# Patient Record
Sex: Female | Born: 1965 | Race: White | Hispanic: No | Marital: Married | Smoking: Current every day smoker
Health system: Southern US, Community
[De-identification: ages and names within clinical notes are randomized; demographics above are authoritative.]

## PROBLEM LIST (undated history)

## (undated) DIAGNOSIS — K219 Gastro-esophageal reflux disease without esophagitis: Secondary | ICD-10-CM

---

## 1998-02-01 ENCOUNTER — Emergency Department (HOSPITAL_COMMUNITY): Admission: EM | Admit: 1998-02-01 | Discharge: 1998-02-01 | Payer: Self-pay | Admitting: Emergency Medicine

## 1998-07-28 ENCOUNTER — Emergency Department (HOSPITAL_COMMUNITY): Admission: EM | Admit: 1998-07-28 | Discharge: 1998-07-28 | Payer: Self-pay | Admitting: Emergency Medicine

## 1998-08-13 ENCOUNTER — Emergency Department (HOSPITAL_COMMUNITY): Admission: EM | Admit: 1998-08-13 | Discharge: 1998-08-13 | Payer: Self-pay | Admitting: Internal Medicine

## 2004-02-12 ENCOUNTER — Emergency Department (HOSPITAL_COMMUNITY): Admission: EM | Admit: 2004-02-12 | Discharge: 2004-02-12 | Payer: Self-pay | Admitting: Family Medicine

## 2004-02-27 ENCOUNTER — Emergency Department (HOSPITAL_COMMUNITY): Admission: EM | Admit: 2004-02-27 | Discharge: 2004-02-27 | Payer: Self-pay | Admitting: Family Medicine

## 2006-06-12 ENCOUNTER — Ambulatory Visit: Payer: Self-pay | Admitting: Sports Medicine

## 2006-08-31 ENCOUNTER — Ambulatory Visit: Payer: Self-pay | Admitting: Sports Medicine

## 2006-09-10 ENCOUNTER — Emergency Department (HOSPITAL_COMMUNITY): Admission: EM | Admit: 2006-09-10 | Discharge: 2006-09-10 | Payer: Self-pay | Admitting: Emergency Medicine

## 2006-10-10 ENCOUNTER — Telehealth (INDEPENDENT_AMBULATORY_CARE_PROVIDER_SITE_OTHER): Payer: Self-pay | Admitting: *Deleted

## 2006-10-15 ENCOUNTER — Encounter (INDEPENDENT_AMBULATORY_CARE_PROVIDER_SITE_OTHER): Payer: Self-pay | Admitting: Sports Medicine

## 2006-10-25 ENCOUNTER — Ambulatory Visit: Payer: Self-pay | Admitting: Family Medicine

## 2006-10-25 DIAGNOSIS — F17200 Nicotine dependence, unspecified, uncomplicated: Secondary | ICD-10-CM | POA: Insufficient documentation

## 2006-10-30 ENCOUNTER — Encounter (INDEPENDENT_AMBULATORY_CARE_PROVIDER_SITE_OTHER): Payer: Self-pay | Admitting: Sports Medicine

## 2006-11-15 ENCOUNTER — Ambulatory Visit: Payer: Self-pay | Admitting: Family Medicine

## 2007-04-29 ENCOUNTER — Ambulatory Visit: Payer: Self-pay | Admitting: Family Medicine

## 2007-04-29 ENCOUNTER — Encounter: Payer: Self-pay | Admitting: Family Medicine

## 2007-04-29 DIAGNOSIS — R5383 Other fatigue: Secondary | ICD-10-CM

## 2007-04-29 DIAGNOSIS — N6019 Diffuse cystic mastopathy of unspecified breast: Secondary | ICD-10-CM | POA: Insufficient documentation

## 2007-04-29 DIAGNOSIS — N920 Excessive and frequent menstruation with regular cycle: Secondary | ICD-10-CM

## 2007-04-29 DIAGNOSIS — R5381 Other malaise: Secondary | ICD-10-CM

## 2007-04-29 LAB — CONVERTED CEMR LAB
Hemoglobin: 14.4 g/dL (ref 12.0–15.0)
LDL Cholesterol: 61 mg/dL (ref 0–99)
MCHC: 33.3 g/dL (ref 30.0–36.0)
RBC: 4.68 M/uL (ref 3.87–5.11)
Triglycerides: 41 mg/dL (ref <150)

## 2007-05-01 ENCOUNTER — Encounter: Payer: Self-pay | Admitting: Family Medicine

## 2007-05-28 ENCOUNTER — Encounter: Admission: RE | Admit: 2007-05-28 | Discharge: 2007-05-28 | Payer: Self-pay | Admitting: Sports Medicine

## 2007-06-05 ENCOUNTER — Encounter: Admission: RE | Admit: 2007-06-05 | Discharge: 2007-06-05 | Payer: Self-pay | Admitting: Sports Medicine

## 2007-06-06 ENCOUNTER — Encounter: Payer: Self-pay | Admitting: Family Medicine

## 2007-11-06 ENCOUNTER — Encounter (INDEPENDENT_AMBULATORY_CARE_PROVIDER_SITE_OTHER): Payer: Self-pay | Admitting: *Deleted

## 2007-11-06 ENCOUNTER — Ambulatory Visit: Payer: Self-pay | Admitting: Family Medicine

## 2007-11-06 DIAGNOSIS — L259 Unspecified contact dermatitis, unspecified cause: Secondary | ICD-10-CM | POA: Insufficient documentation

## 2007-11-06 DIAGNOSIS — R110 Nausea: Secondary | ICD-10-CM | POA: Insufficient documentation

## 2007-11-06 LAB — CONVERTED CEMR LAB
AST: 13 units/L (ref 0–37)
Alkaline Phosphatase: 44 units/L (ref 39–117)
BUN: 15 mg/dL (ref 6–23)
Beta hcg, urine, semiquantitative: NEGATIVE
Glucose, Bld: 98 mg/dL (ref 70–99)
Glucose, Urine, Semiquant: NEGATIVE
Ketones, urine, test strip: NEGATIVE
Nitrite: NEGATIVE
Protein, U semiquant: NEGATIVE
Specific Gravity, Urine: >=1.03

## 2007-11-07 ENCOUNTER — Telehealth: Payer: Self-pay | Admitting: *Deleted

## 2008-02-27 ENCOUNTER — Ambulatory Visit: Payer: Self-pay | Admitting: Family Medicine

## 2008-02-27 LAB — CONVERTED CEMR LAB
BUN: 11 mg/dL (ref 6–23)
Calcium: 8.9 mg/dL (ref 8.4–10.5)
Creatinine, Ser: 0.77 mg/dL (ref 0.40–1.20)

## 2008-02-28 ENCOUNTER — Encounter: Payer: Self-pay | Admitting: Family Medicine

## 2008-05-28 ENCOUNTER — Encounter: Admission: RE | Admit: 2008-05-28 | Discharge: 2008-05-28 | Payer: Self-pay | Admitting: Family Medicine

## 2009-06-11 ENCOUNTER — Encounter: Admission: RE | Admit: 2009-06-11 | Discharge: 2009-06-11 | Payer: Self-pay | Admitting: Family Medicine

## 2009-06-11 ENCOUNTER — Encounter: Payer: Self-pay | Admitting: Family Medicine

## 2010-02-08 ENCOUNTER — Emergency Department (HOSPITAL_COMMUNITY): Admission: EM | Admit: 2010-02-08 | Discharge: 2010-02-08 | Payer: Self-pay | Admitting: Emergency Medicine

## 2010-06-27 ENCOUNTER — Emergency Department (HOSPITAL_COMMUNITY): Admission: EM | Admit: 2010-06-27 | Discharge: 2010-06-27 | Payer: Self-pay | Admitting: Emergency Medicine

## 2010-09-25 ENCOUNTER — Encounter: Payer: Self-pay | Admitting: *Deleted

## 2010-11-20 IMAGING — MG MM SCREEN MAMMOGRAM BILATERAL
4 series · 4 of 4 positions shown · non-contrast
Comparison: Prior studies.

DG SCREEN MAMMOGRAM BILATERAL
Bilateral CC and MLO view(s) were taken.
Technologist: Lusine Jim
Prior study comparison: May 28, 2007, DG screen mammogram bilateral.

DIGITAL SCREENING MAMMOGRAM WITH CAD:

[R CC]
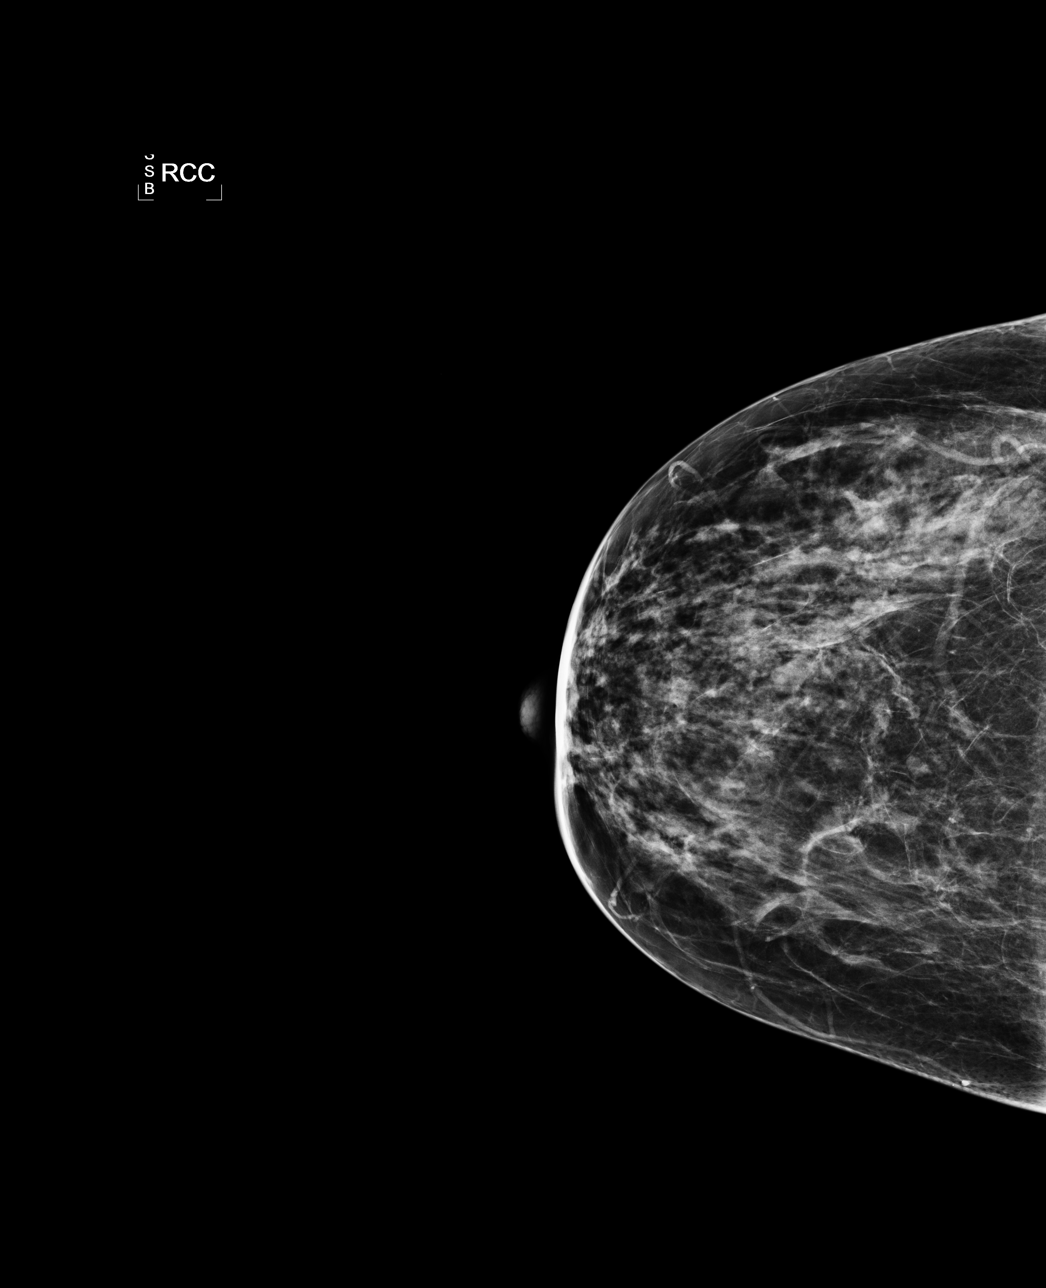

[L CC]
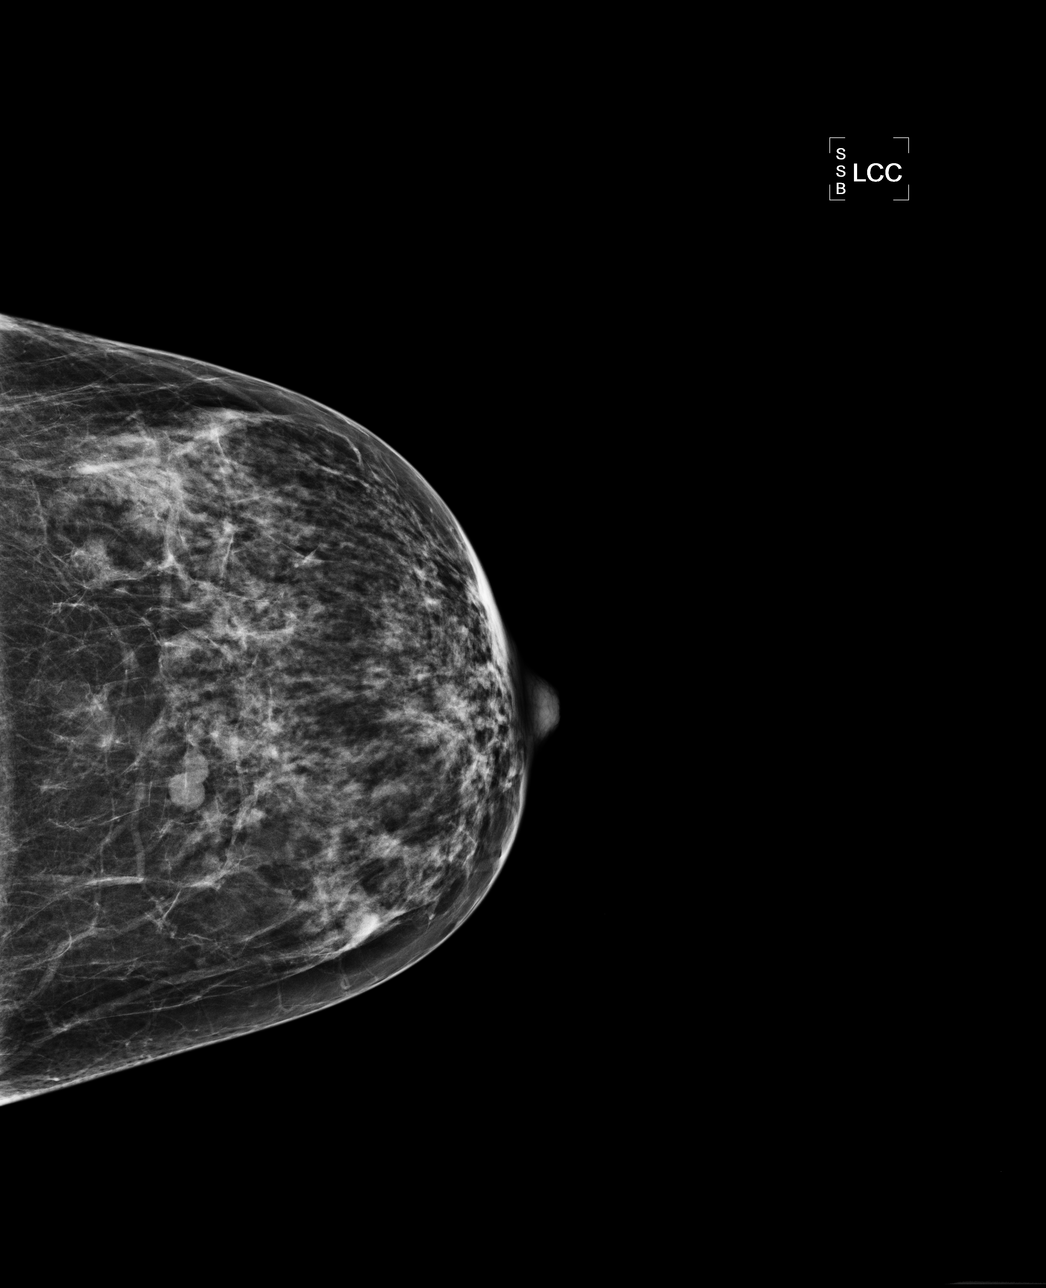

[L MLO]
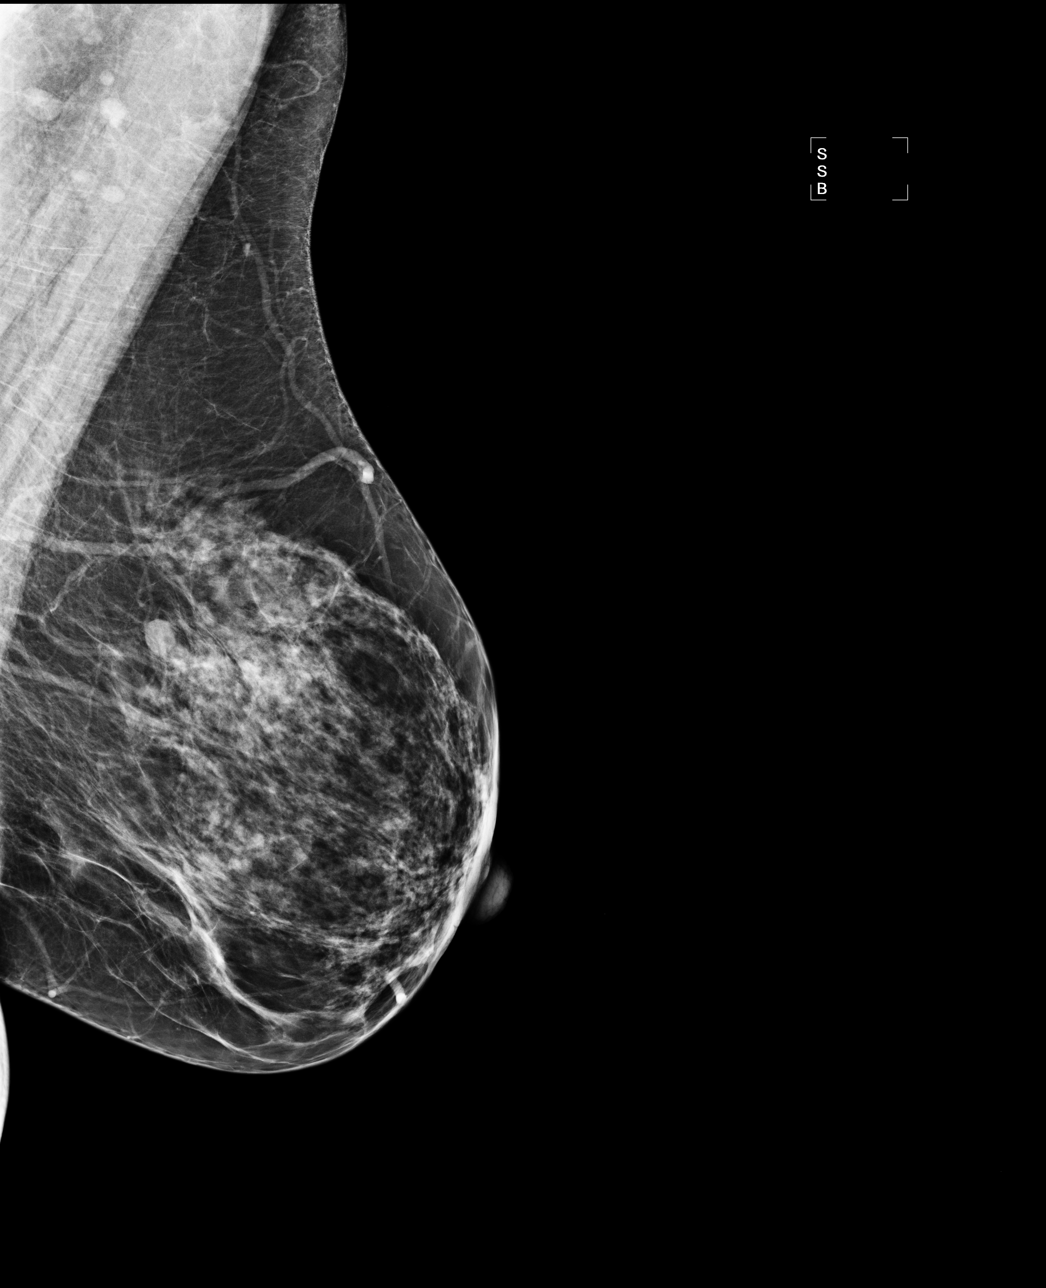

[R MLO]
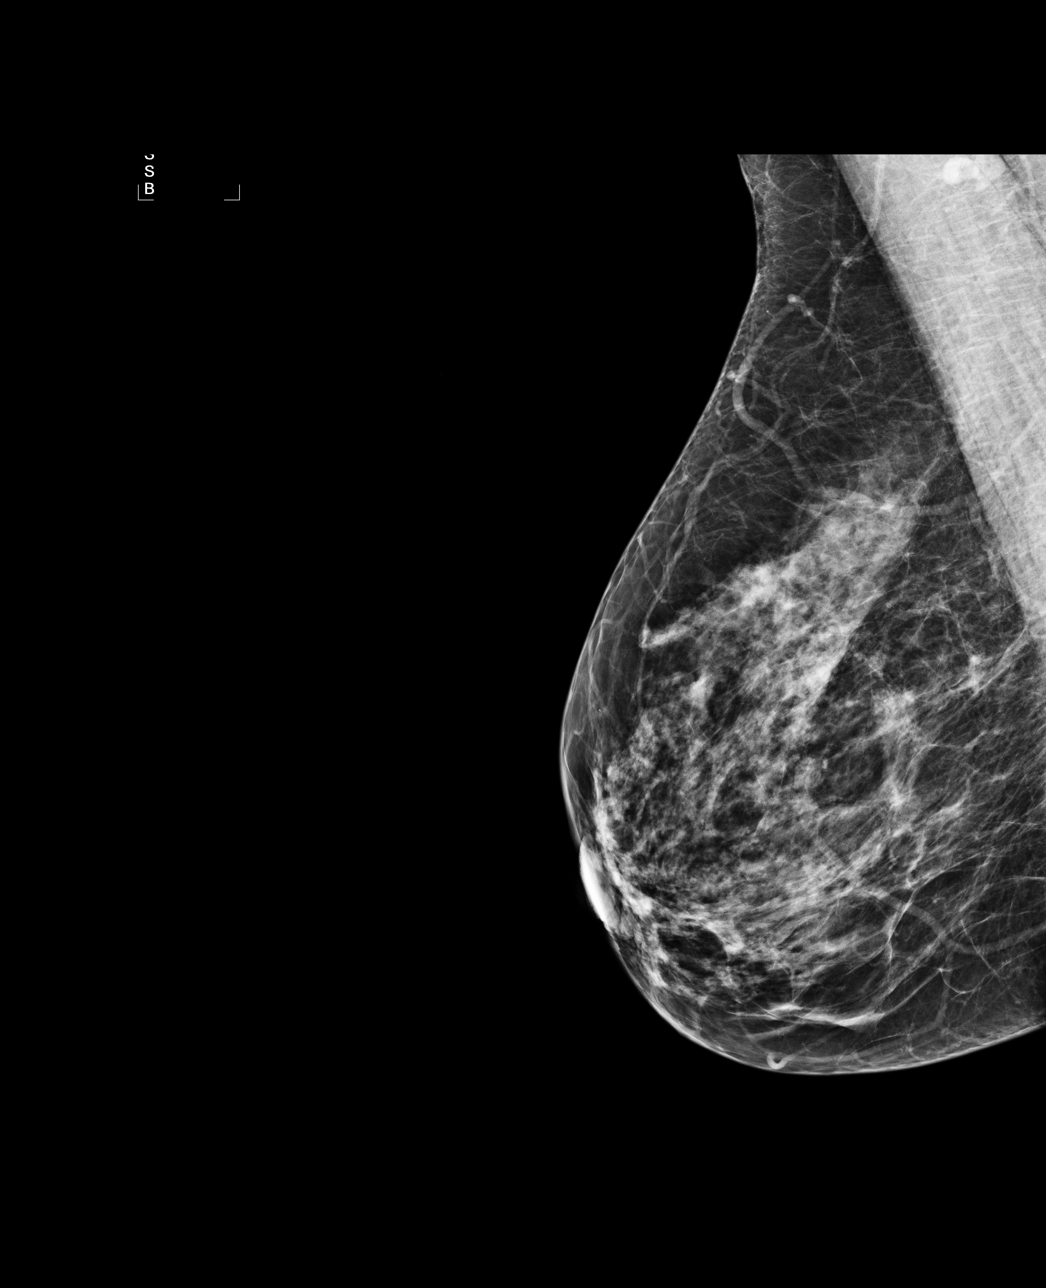

[4 of 4 positions shown; findings below may reference images not displayed]

There are scattered fibroglandular densities.  There is no dominant mass, architectural distortion 
or calcification to suggest malignancy.

Images were processed with CAD.
IMPRESSION: No mammographic evidence of malignancy.  Suggest yearly screening mammography.

A result letter of this screening mammogram will be mailed directly to the patient.

ASSESSMENT: Negative - BI-RADS 1

Screening mammogram in 1 year.
,

## 2013-01-06 ENCOUNTER — Emergency Department (HOSPITAL_COMMUNITY)
Admission: EM | Admit: 2013-01-06 | Discharge: 2013-01-06 | Disposition: A | Discharge status: Home / Self Care | Payer: Self-pay | Attending: Emergency Medicine | Admitting: Emergency Medicine

## 2013-01-06 ENCOUNTER — Encounter (HOSPITAL_COMMUNITY): Payer: Self-pay | Admitting: Emergency Medicine

## 2013-01-06 DIAGNOSIS — F17200 Nicotine dependence, unspecified, uncomplicated: Secondary | ICD-10-CM | POA: Insufficient documentation

## 2013-01-06 DIAGNOSIS — R197 Diarrhea, unspecified: Secondary | ICD-10-CM | POA: Insufficient documentation

## 2013-01-06 DIAGNOSIS — Z3202 Encounter for pregnancy test, result negative: Secondary | ICD-10-CM | POA: Insufficient documentation

## 2013-01-06 DIAGNOSIS — R112 Nausea with vomiting, unspecified: Secondary | ICD-10-CM | POA: Insufficient documentation

## 2013-01-06 DIAGNOSIS — K219 Gastro-esophageal reflux disease without esophagitis: Secondary | ICD-10-CM | POA: Insufficient documentation

## 2013-01-06 HISTORY — DX: Gastro-esophageal reflux disease without esophagitis: K21.9

## 2013-01-06 LAB — URINALYSIS, ROUTINE W REFLEX MICROSCOPIC
Bilirubin Urine: NEGATIVE
Ketones, ur: NEGATIVE mg/dL
Leukocytes, UA: NEGATIVE
Protein, ur: NEGATIVE mg/dL
Urobilinogen, UA: 0.2 mg/dL (ref 0.0–1.0)

## 2013-01-06 LAB — URINE MICROSCOPIC-ADD ON

## 2013-01-06 LAB — COMPREHENSIVE METABOLIC PANEL
AST: 23 U/L (ref 0–37)
Albumin: 4.1 g/dL (ref 3.5–5.2)
Alkaline Phosphatase: 76 U/L (ref 39–117)
CO2: 25 mEq/L (ref 19–32)
Chloride: 102 mEq/L (ref 96–112)
Creatinine, Ser: 0.75 mg/dL (ref 0.50–1.10)
GFR calc Af Amer: >90 mL/min (ref >90)
Glucose, Bld: 113 mg/dL — ABNORMAL HIGH (ref 70–99)
Potassium: 4.2 mEq/L (ref 3.5–5.1)
Total Bilirubin: 0.2 mg/dL — ABNORMAL LOW (ref 0.3–1.2)

## 2013-01-06 LAB — CBC WITH DIFFERENTIAL/PLATELET
Basophils Absolute: 0.1 10*3/uL (ref 0.0–0.1)
Hemoglobin: 16 g/dL — ABNORMAL HIGH (ref 12.0–15.0)
Lymphocytes Relative: 31 % (ref 12–46)
MCH: 30.6 pg (ref 26.0–34.0)
MCV: 90.4 fL (ref 78.0–100.0)
Neutro Abs: 4 10*3/uL (ref 1.7–7.7)
Platelets: 307 10*3/uL (ref 150–400)
RBC: 5.23 MIL/uL — ABNORMAL HIGH (ref 3.87–5.11)

## 2013-01-06 LAB — POCT PREGNANCY, URINE: Preg Test, Ur: NEGATIVE

## 2013-01-06 MED ORDER — SODIUM CHLORIDE 0.9 % IV BOLUS (SEPSIS)
1000.0000 mL | Freq: Once | INTRAVENOUS | Status: AC
  Administered 2013-01-06: 1000 mL via INTRAVENOUS

## 2013-01-06 MED ORDER — ONDANSETRON HCL 4 MG PO TABS: 4.0000 mg | ORAL_TABLET | Freq: Three times a day (TID) | ORAL | Status: DC | PRN

## 2013-01-06 NOTE — ED Notes (Signed)
Pt states nausea x 4-5 months but just started to have n/v/d since Friday . No  Vomiting today but cont w/ diarrhera has had bad acid reflux also states has a beer or 2  Everyday after work. Has only had yougart since friday

## 2013-01-06 NOTE — ED Notes (Signed)
Pt ambulated to restroom. Tolerated well.

## 2013-01-06 NOTE — ED Provider Notes (Signed)
History     CSN: [id number]  Arrival date & time 01/06/13  1111   First MD Initiated Contact with Patient 01/06/13 1131      Chief Complaint  Patient presents with  . Abdominal Pain    (Consider location/radiation/quality/duration/timing/severity/associated sxs/prior treatment) HPI Comments: Patient presents emergency department with chief complaint of nausea, vomiting, and diarrhea since Friday. She states that the vomiting has eased off today. She is still having diarrhea. She denies seeing any blood in her vomit or stool. She states that nothing makes her symptoms any better. She states that she feels dehydrated. She denies fever, chills, chest pain, shortness of breath, constipation, dysuria, or vaginal discharge. She has not taken anything to alleviate her symptoms.  Patient states that her grandchildren have been sick with similar.  The history is provided by the patient. No language interpreter was used.    Past Medical History  Diagnosis Date  . Acid reflux     History reviewed. No pertinent past surgical history.  History reviewed. No pertinent family history.  History  Substance Use Topics  . Smoking status: Current Every Day Smoker  . Smokeless tobacco: Not on file  . Alcohol Use: Yes    OB History   Grav Para Term Preterm Abortions TAB SAB Ect Mult Living                  Review of Systems  All other systems reviewed and are negative.    Allergies  Penicillins  Home Medications   Current Outpatient Rx  Name  Route  Sig  Dispense  Refill  . Cimetidine (ACID REDUCER PO)   Oral   Take 1 tablet by mouth daily.         Marland Kitchen ibuprofen (ADVIL,MOTRIN) 200 MG tablet   Oral   Take 200 mg by mouth every 6 (six) hours as needed for pain.         . promethazine (PHENERGAN) 25 MG tablet   Oral   Take 25 mg by mouth every 6 (six) hours as needed.             BP 138/88  Pulse 107  Temp(Src) 98.2 F (36.8 C) (Oral)  SpO2 98%  Physical Exam   Nursing note and vitals reviewed. Constitutional: She is oriented to person, place, and time. She appears well-developed and well-nourished.  HENT:  Head: Normocephalic and atraumatic.  Eyes: Conjunctivae and EOM are normal. Pupils are equal, round, and reactive to light.  Neck: Normal range of motion. Neck supple.  Cardiovascular: Normal rate and regular rhythm.  Exam reveals no gallop and no friction rub.   No murmur heard. Pulmonary/Chest: Effort normal and breath sounds normal. No respiratory distress. She has no wheezes. She has no rales. She exhibits no tenderness.  Abdominal: Soft. Bowel sounds are normal. She exhibits no distension and no mass. There is no tenderness. There is no rebound and no guarding.  Diffuse abdominal discomfort, but no focal tenderness, no signs of surgical or acute abdomen, no fluid wave or signs of peritonitis  Musculoskeletal: Normal range of motion. She exhibits no edema and no tenderness.  Neurological: She is alert and oriented to person, place, and time.  Skin: Skin is warm and dry.  Psychiatric: She has a normal mood and affect. Her behavior is normal. Judgment and thought content normal.    ED Course  Procedures (including critical care time)  Labs Reviewed  CBC WITH DIFFERENTIAL - Abnormal; Notable for the following:  RBC 5.23 (*)    Hemoglobin 16.0 (*)    HCT 47.3 (*)    All other components within normal limits  COMPREHENSIVE METABOLIC PANEL  LIPASE, BLOOD  URINALYSIS, ROUTINE W REFLEX MICROSCOPIC   Results for orders placed during the hospital encounter of 01/06/13  COMPREHENSIVE METABOLIC PANEL      Result Value Range   Sodium 136  135 - 145 mEq/L   Potassium 4.2  3.5 - 5.1 mEq/L   Chloride 102  96 - 112 mEq/L   CO2 25  19 - 32 mEq/L   Glucose, Bld 113 (*) 70 - 99 mg/dL   BUN 9  6 - 23 mg/dL   Creatinine, Ser 1.61  0.50 - 1.10 mg/dL   Calcium 8.9  8.4 - 09.6 mg/dL   Total Protein 7.4  6.0 - 8.3 g/dL   Albumin 4.1  3.5 - 5.2  g/dL   AST 23  0 - 37 U/L   ALT 21  0 - 35 U/L   Alkaline Phosphatase 76  39 - 117 U/L   Total Bilirubin 0.2 (*) 0.3 - 1.2 mg/dL   GFR calc non Af Amer >90  >90 mL/min   GFR calc Af Amer >90  >90 mL/min  LIPASE, BLOOD      Result Value Range   Lipase 19  11 - 59 U/L  CBC WITH DIFFERENTIAL      Result Value Range   WBC 7.0  4.0 - 10.5 K/uL   RBC 5.23 (*) 3.87 - 5.11 MIL/uL   Hemoglobin 16.0 (*) 12.0 - 15.0 g/dL   HCT 04.5 (*) 40.9 - 81.1 %   MCV 90.4  78.0 - 100.0 fL   MCH 30.6  26.0 - 34.0 pg   MCHC 33.8  30.0 - 36.0 g/dL   RDW 91.4  78.2 - 95.6 %   Platelets 307  150 - 400 K/uL   Neutrophils Relative % 58  43 - 77 %   Neutro Abs 4.0  1.7 - 7.7 K/uL   Lymphocytes Relative 31  12 - 46 %   Lymphs Abs 2.2  0.7 - 4.0 K/uL   Monocytes Relative 6  3 - 12 %   Monocytes Absolute 0.4  0.1 - 1.0 K/uL   Eosinophils Relative 4  0 - 5 %   Eosinophils Absolute 0.3  0.0 - 0.7 K/uL   Basophils Relative 1  0 - 1 %   Basophils Absolute 0.1  0.0 - 0.1 K/uL  URINALYSIS, ROUTINE W REFLEX MICROSCOPIC      Result Value Range   Color, Urine YELLOW  YELLOW   APPearance CLEAR  CLEAR   Specific Gravity, Urine 1.013  1.005 - 1.030   pH 6.0  5.0 - 8.0   Glucose, UA NEGATIVE  NEGATIVE mg/dL   Hgb urine dipstick TRACE (*) NEGATIVE   Bilirubin Urine NEGATIVE  NEGATIVE   Ketones, ur NEGATIVE  NEGATIVE mg/dL   Protein, ur NEGATIVE  NEGATIVE mg/dL   Urobilinogen, UA 0.2  0.0 - 1.0 mg/dL   Nitrite NEGATIVE  NEGATIVE   Leukocytes, UA NEGATIVE  NEGATIVE  URINE MICROSCOPIC-ADD ON      Result Value Range   Squamous Epithelial / LPF FEW (*) RARE   WBC, UA 0-2  <3 WBC/hpf   RBC / HPF 0-2  <3 RBC/hpf   Bacteria, UA FEW (*) RARE  POCT PREGNANCY, URINE      Result Value Range   Preg Test, Ur NEGATIVE  NEGATIVE  No results found.    1. Nausea vomiting and diarrhea       MDM  Patient with nausea, vomiting, and diarrhea. Suspect this is likely viral in origin. Will treat the patient with fluids,  and Zofran needed. Check basic labs, and reevaluate.  Labs are reassuring.  Given a liter of fluid and encourage rehydration.  Suspect viral gastroenteritis.  No focal abdominal tenderness.  No fever.  Will discharge to home with zofran.  Specific return precautions given.  Patient is stable and ready for discharge.  Tolerating PO.       Roxy Horseman, PA-C 01/06/13 1520

## 2013-01-07 NOTE — ED Provider Notes (Signed)
Medical screening examination/treatment/procedure(s) were performed by non-physician practitioner and as supervising physician I was immediately available for consultation/collaboration.   Carleene Cooper III, MD 01/07/13 [phone]

## 2017-10-16 ENCOUNTER — Emergency Department (HOSPITAL_COMMUNITY): Payer: Self-pay

## 2017-10-16 ENCOUNTER — Encounter (HOSPITAL_COMMUNITY): Payer: Self-pay | Admitting: Emergency Medicine

## 2017-10-16 ENCOUNTER — Emergency Department (HOSPITAL_COMMUNITY)
Admission: EM | Admit: 2017-10-16 | Discharge: 2017-10-16 | Disposition: A | Discharge status: Home / Self Care | Payer: Self-pay | Attending: Emergency Medicine | Admitting: Emergency Medicine

## 2017-10-16 ENCOUNTER — Other Ambulatory Visit: Payer: Self-pay

## 2017-10-16 DIAGNOSIS — R6889 Other general symptoms and signs: Secondary | ICD-10-CM | POA: Insufficient documentation

## 2017-10-16 DIAGNOSIS — F17200 Nicotine dependence, unspecified, uncomplicated: Secondary | ICD-10-CM | POA: Insufficient documentation

## 2017-10-16 LAB — CBC WITH DIFFERENTIAL/PLATELET
BASOS ABS: 0 10*3/uL (ref 0.0–0.1)
Basophils Relative: 1 %
Eosinophils Absolute: 0 10*3/uL (ref 0.0–0.7)
Eosinophils Relative: 0 %
HEMATOCRIT: 41.9 % (ref 36.0–46.0)
HEMOGLOBIN: 13.9 g/dL (ref 12.0–15.0)
LYMPHS PCT: 20 %
Lymphs Abs: 1.1 10*3/uL (ref 0.7–4.0)
MCH: 30.8 pg (ref 26.0–34.0)
MCHC: 33.2 g/dL (ref 30.0–36.0)
MCV: 92.7 fL (ref 78.0–100.0)
MONO ABS: 0.4 10*3/uL (ref 0.1–1.0)
MONOS PCT: 8 %
NEUTROS ABS: 3.8 10*3/uL (ref 1.7–7.7)
NEUTROS PCT: 71 %
Platelets: 198 10*3/uL (ref 150–400)
RBC: 4.52 MIL/uL (ref 3.87–5.11)
RDW: 13.5 % (ref 11.5–15.5)
WBC: 5.4 10*3/uL (ref 4.0–10.5)

## 2017-10-16 LAB — URINALYSIS, ROUTINE W REFLEX MICROSCOPIC
GLUCOSE, UA: NEGATIVE mg/dL
KETONES UR: 15 mg/dL — AB
Leukocytes, UA: NEGATIVE
Nitrite: NEGATIVE
Protein, ur: 100 mg/dL — AB
Specific Gravity, Urine: >1.03 — ABNORMAL HIGH (ref 1.005–1.030)
pH: 6 (ref 5.0–8.0)

## 2017-10-16 LAB — COMPREHENSIVE METABOLIC PANEL
ALBUMIN: 3.6 g/dL (ref 3.5–5.0)
ALT: 17 U/L (ref 14–54)
ANION GAP: 10 (ref 5–15)
AST: 27 U/L (ref 15–41)
Alkaline Phosphatase: 56 U/L (ref 38–126)
BUN: 9 mg/dL (ref 6–20)
CHLORIDE: 102 mmol/L (ref 101–111)
CO2: 22 mmol/L (ref 22–32)
Calcium: 8.3 mg/dL — ABNORMAL LOW (ref 8.9–10.3)
Creatinine, Ser: 0.83 mg/dL (ref 0.44–1.00)
GFR calc Af Amer: >60 mL/min (ref >60)
GFR calc non Af Amer: >60 mL/min (ref >60)
GLUCOSE: 109 mg/dL — AB (ref 65–99)
POTASSIUM: 3.6 mmol/L (ref 3.5–5.1)
SODIUM: 134 mmol/L — AB (ref 135–145)
Total Bilirubin: 0.4 mg/dL (ref 0.3–1.2)
Total Protein: 6.3 g/dL — ABNORMAL LOW (ref 6.5–8.1)

## 2017-10-16 LAB — URINALYSIS, MICROSCOPIC (REFLEX)

## 2017-10-16 LAB — I-STAT CG4 LACTIC ACID, ED: LACTIC ACID, VENOUS: 0.52 mmol/L (ref 0.5–1.9)

## 2017-10-16 MED ORDER — BENZONATATE 100 MG PO CAPS: 100.0000 mg | ORAL_CAPSULE | Freq: Three times a day (TID) | ORAL | 0 refills | Status: AC

## 2017-10-16 MED ORDER — ONDANSETRON HCL 4 MG PO TABS: 4.0000 mg | ORAL_TABLET | Freq: Four times a day (QID) | ORAL | 0 refills | Status: AC

## 2017-10-16 NOTE — ED Triage Notes (Signed)
Pt c/o flu symptoms started Saturday night-- with vomiting, fever, -- unable to keep anything down

## 2017-10-16 NOTE — ED Provider Notes (Signed)
[facility] EMERGENCY DEPARTMENT Provider Note   CSN: [id number] Arrival date & time: 10/16/17  1127     History   Chief Complaint Chief Complaint  Patient presents with  . flu symptoms  . Emesis    HPI Tiffany Schroeder is a 52 y.o. female.  HPI   52 year old female presents today with complaints of nausea vomiting diarrhea and upper respiratory congestion.  Patient notes that symptoms started 4 days ago with fatigue, body aches, nausea vomiting and diarrhea.  She notes she progressed with nasal congestion rhinorrhea and cough.  Patient notes she is no longer having diarrhea or vomiting but continues to have fatigue body aches and congestion.  Patient denies any significant shortness of breath.  She reports fever at home, Tylenol prior to arrival.  Patient notes she is a smoker but otherwise healthy taking no significant medications.  She denies any past medical history including asthma, COPD, diabetes or hypertension.  Patient notes that she generally does not get sick.  Patient reports some generalized abdominal pain which she attributes to frequent coughing.  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  Past Medical History:  Diagnosis Date  . Acid reflux     Patient Active Problem List   Diagnosis Date Noted  . CONTACT DERMATITIS&OTHER ECZEMA DUE UNSPEC CAUSE 11/06/2007  . NAUSEA 11/06/2007  . FIBROCYSTIC BREAST DISEASE 04/29/2007  . MENORRHAGIA 04/29/2007  . Other malaise and fatigue 04/29/2007  . TOBACCO USE 10/25/2006    History reviewed. No pertinent surgical history.  OB History    No data available       Home Medications    Prior to Admission medications   Medication Sig Start Date End Date Taking? Authorizing Provider  benzonatate (TESSALON) 100 MG capsule Take 1 capsule (100 mg total) by mouth every 8 (eight) hours. 10/16/17   Crimson Dubberly, Tinnie Gens, PA-C  Cimetidine (ACID REDUCER PO) Take 1 tablet by mouth daily.    [provider]   ibuprofen (ADVIL,MOTRIN) 200 MG tablet Take 200 mg by mouth every 6 (six) hours as needed for pain.    [provider]  ondansetron (ZOFRAN) 4 MG tablet Take 1 tablet (4 mg total) by mouth every 6 (six) hours. 10/16/17   Tatelyn Vanhecke, Tinnie Gens, PA-C  promethazine (PHENERGAN) 25 MG tablet Take 25 mg by mouth every 6 (six) hours as needed.      [provider]    Family History No family history on file.  Social History Social History   Tobacco Use  . Smoking status: Current Every Day Smoker  . Smokeless tobacco: Never Used  Substance Use Topics  . Alcohol use: Yes  . Drug use: No     Allergies   Penicillins   Review of Systems Review of Systems  All other systems reviewed and are negative.    Physical Exam Updated Vital Signs BP 107/72 (BP Location: Right Arm)   Pulse (!) 113   Temp 99.6 F (37.6 C) (Oral)   Resp 17   SpO2 94%   Physical Exam  Constitutional: She is oriented to person, place, and time. She appears well-developed and well-nourished.  HENT:  Head: Normocephalic and atraumatic.  Nose: Rhinorrhea present.  Eyes: Conjunctivae are normal. Pupils are equal, round, and reactive to light. Right eye exhibits no discharge. Left eye exhibits no discharge. No scleral icterus.  Neck: Normal range of motion. No JVD present. No tracheal deviation present.  Cardiovascular: Regular rhythm, normal heart sounds and intact distal pulses.  Pulmonary/Chest: Effort normal and breath sounds normal. No stridor. No respiratory distress. She has no wheezes. She has no rales. She exhibits no tenderness.  Abdominal: Soft.  Generalized epigastric tenderness nonfocal nonsevere remainder abdominal exam nontender  Neurological: She is alert and oriented to person, place, and time. Coordination normal.  Skin: Skin is warm.  Psychiatric: She has a normal mood and affect. Her behavior is normal. Judgment and thought content normal.  Nursing note and vitals  reviewed.    ED Treatments / Results  Labs (all labs ordered are listed, but only abnormal results are displayed) Labs Reviewed  COMPREHENSIVE METABOLIC PANEL - Abnormal; Notable for the following components:      Result Value   Sodium 134 (*)    Glucose, Bld 109 (*)    Calcium 8.3 (*)    Total Protein 6.3 (*)    All other components within normal limits  URINALYSIS, ROUTINE W REFLEX MICROSCOPIC - Abnormal; Notable for the following components:   APPearance CLOUDY (*)    Specific Gravity, Urine >1.030 (*)    Hgb urine dipstick LARGE (*)    Bilirubin Urine MODERATE (*)    Ketones, ur 15 (*)    Protein, ur 100 (*)    All other components within normal limits  URINALYSIS, MICROSCOPIC (REFLEX) - Abnormal; Notable for the following components:   Bacteria, UA FEW (*)    Squamous Epithelial / LPF 6-30 (*)    All other components within normal limits  CBC WITH DIFFERENTIAL/PLATELET  I-STAT CG4 LACTIC ACID, ED  I-STAT CG4 LACTIC ACID, ED    EKG  EKG Interpretation None       Radiology Dg Chest 2 View  Result Date: 10/16/2017 CLINICAL DATA:  Flu symptoms.  Fever and vomiting. EXAM: CHEST - 2 VIEW COMPARISON:  No recent prior. FINDINGS: Mediastinum hilar structures normal. Lungs are clear. No pleural effusion or pneumothorax. Heart size normal. No acute bony abnormality. IMPRESSION: No acute abnormality. Electronically Signed   By: Maisie Fus  Register   On: 10/16/2017 12:12    Procedures Procedures (including critical care time)  Medications Ordered in ED Medications - No data to display   Initial Impression / Assessment and Plan / ED Course  I have reviewed the triage vital signs and the nursing notes.  Pertinent labs & imaging results that were available during my care of the patient were reviewed by me and considered in my medical decision making (see chart for details).      Final Clinical Impressions(s) / ED Diagnoses   Final diagnoses:  Flu-like symptoms     Labs: I-STAT lactic acid, CMP, CBC, urinalysis  Imaging: DG chest 2 view  Consults:  Therapeutics:  Discharge Meds: Zofran, Tessalon  Assessment/Plan: 52 year old female presents today with viral illness.  Patient with upper respiratory congestion.  Originally with nausea vomiting diarrhea.  Question influenza in this patient.  She is a smoker no diagnosed history of asthma COPD or any comp gating features.  I discussed testing and treatment patient, she would not like to take any medication for this.  Patient's laboratory analysis reassuring with no significant derangements, no signs of intra-abdominal pathology.  I find this reasonable at this time.  Patient is given strict return precautions, she is encouraged follow-up with her primary care if symptoms persist return if they worsen.  She verbalized understanding and agreement to today's plan had no further questions or concerns.     ED Discharge Orders        Ordered  ondansetron (ZOFRAN) 4 MG tablet  Every 6 hours     10/16/17 1340    benzonatate (TESSALON) 100 MG capsule  Every 8 hours     10/16/17 1340       Eyvonne MechanicHedges, Giavanna Kang, PA-C 10/16/17 1343    Mabe, Latanya MaudlinMartha L, MD 10/16/17 1409

## 2017-10-16 NOTE — Discharge Instructions (Signed)
Please read attached information. If you experience any new or worsening signs or symptoms please return to the emergency room for evaluation. Please follow-up with your primary care provider or specialist as discussed. Please use medication prescribed only as directed and discontinue taking if you have any concerning signs or symptoms.   °

## 2018-10-14 ENCOUNTER — Ambulatory Visit: Payer: Self-pay | Admitting: Family Medicine

## 2019-01-01 DIAGNOSIS — K2100 Gastro-esophageal reflux disease with esophagitis, without bleeding: Secondary | ICD-10-CM | POA: Insufficient documentation

## 2019-01-01 DIAGNOSIS — F17210 Nicotine dependence, cigarettes, uncomplicated: Secondary | ICD-10-CM | POA: Insufficient documentation

## 2019-03-27 IMAGING — DX DG CHEST 2V
2 series · 2 of 2 positions shown · non-contrast
Comparison: No recent prior.

CLINICAL DATA: Flu symptoms.  Fever and vomiting.

EXAM:
CHEST - 2 VIEW

[chest pa]
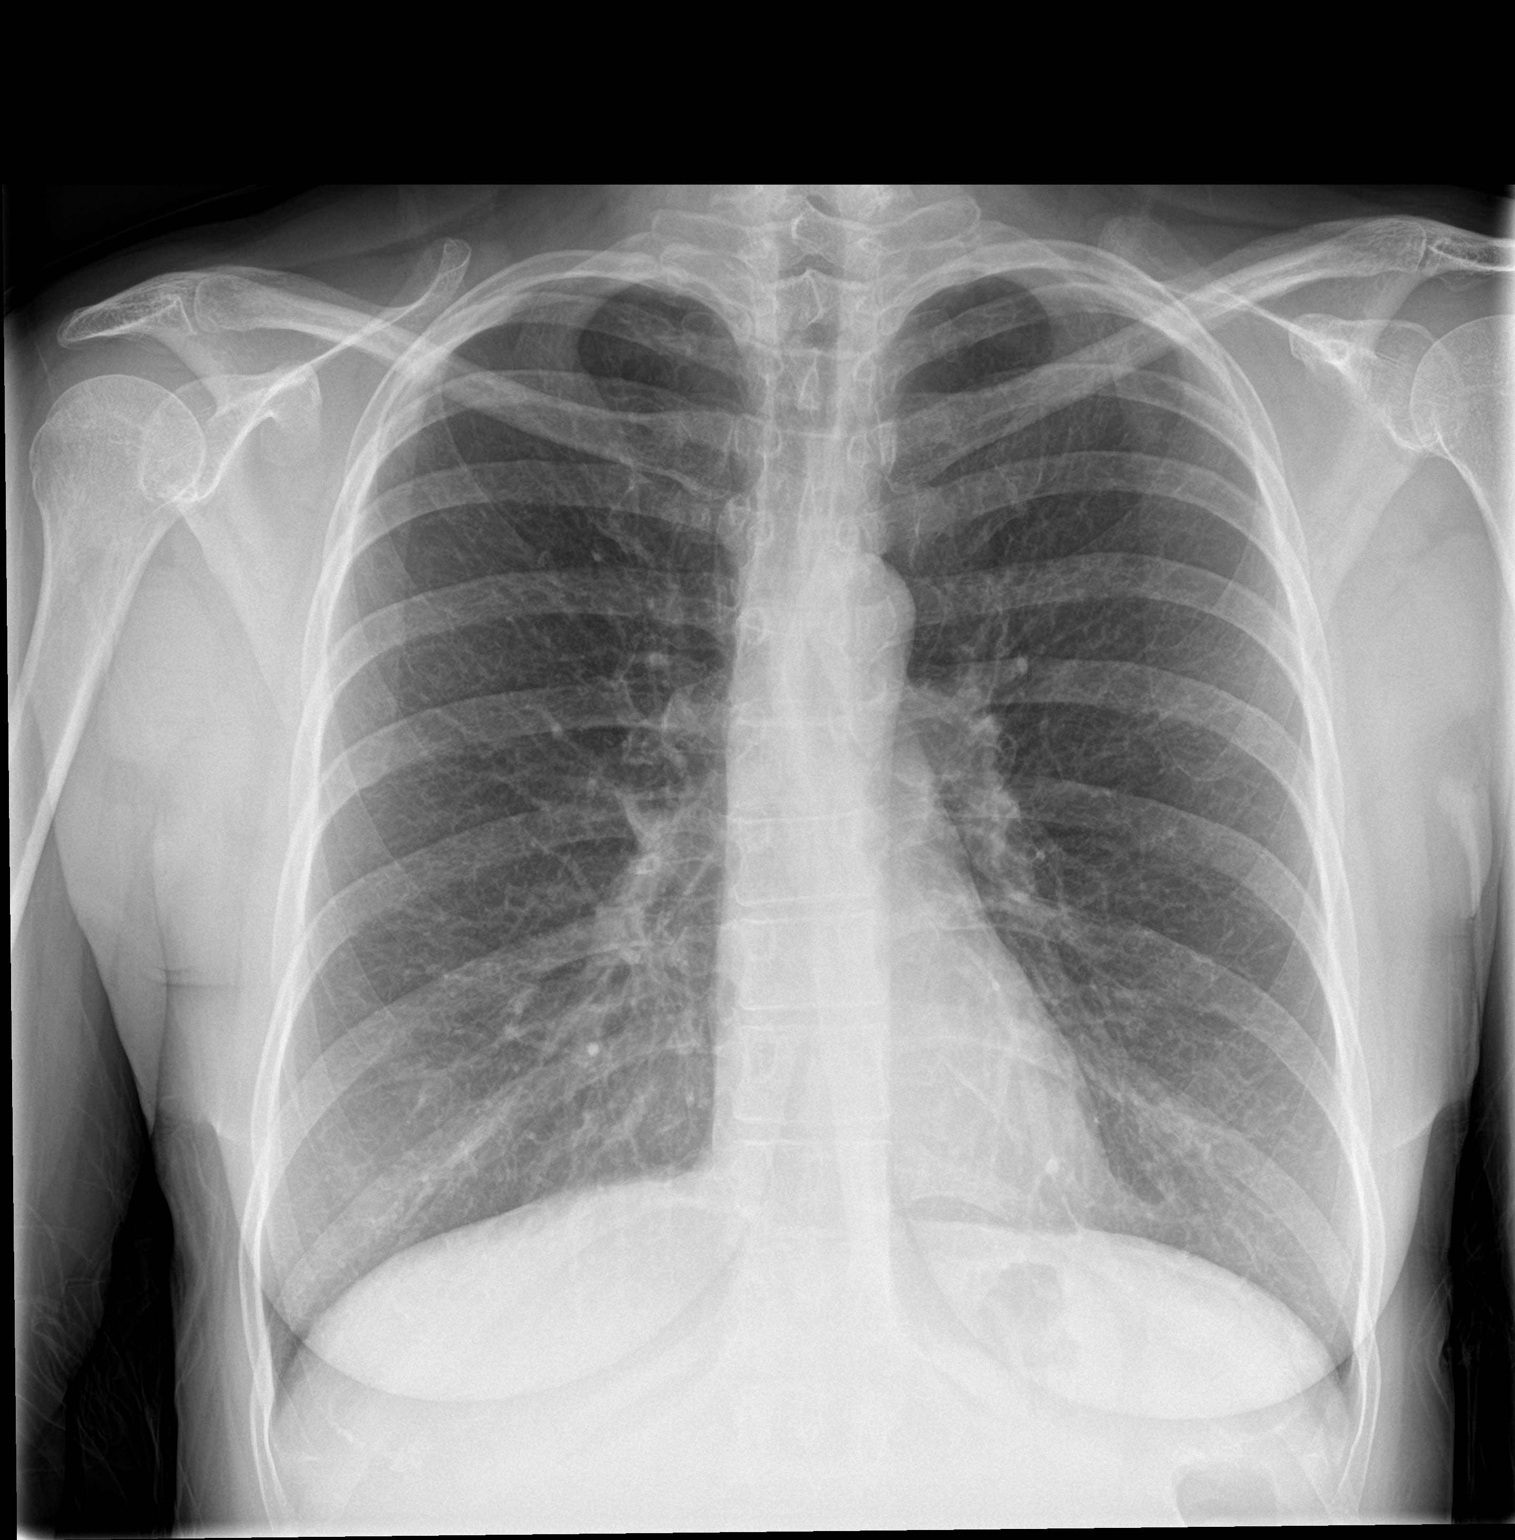

[chest lat]
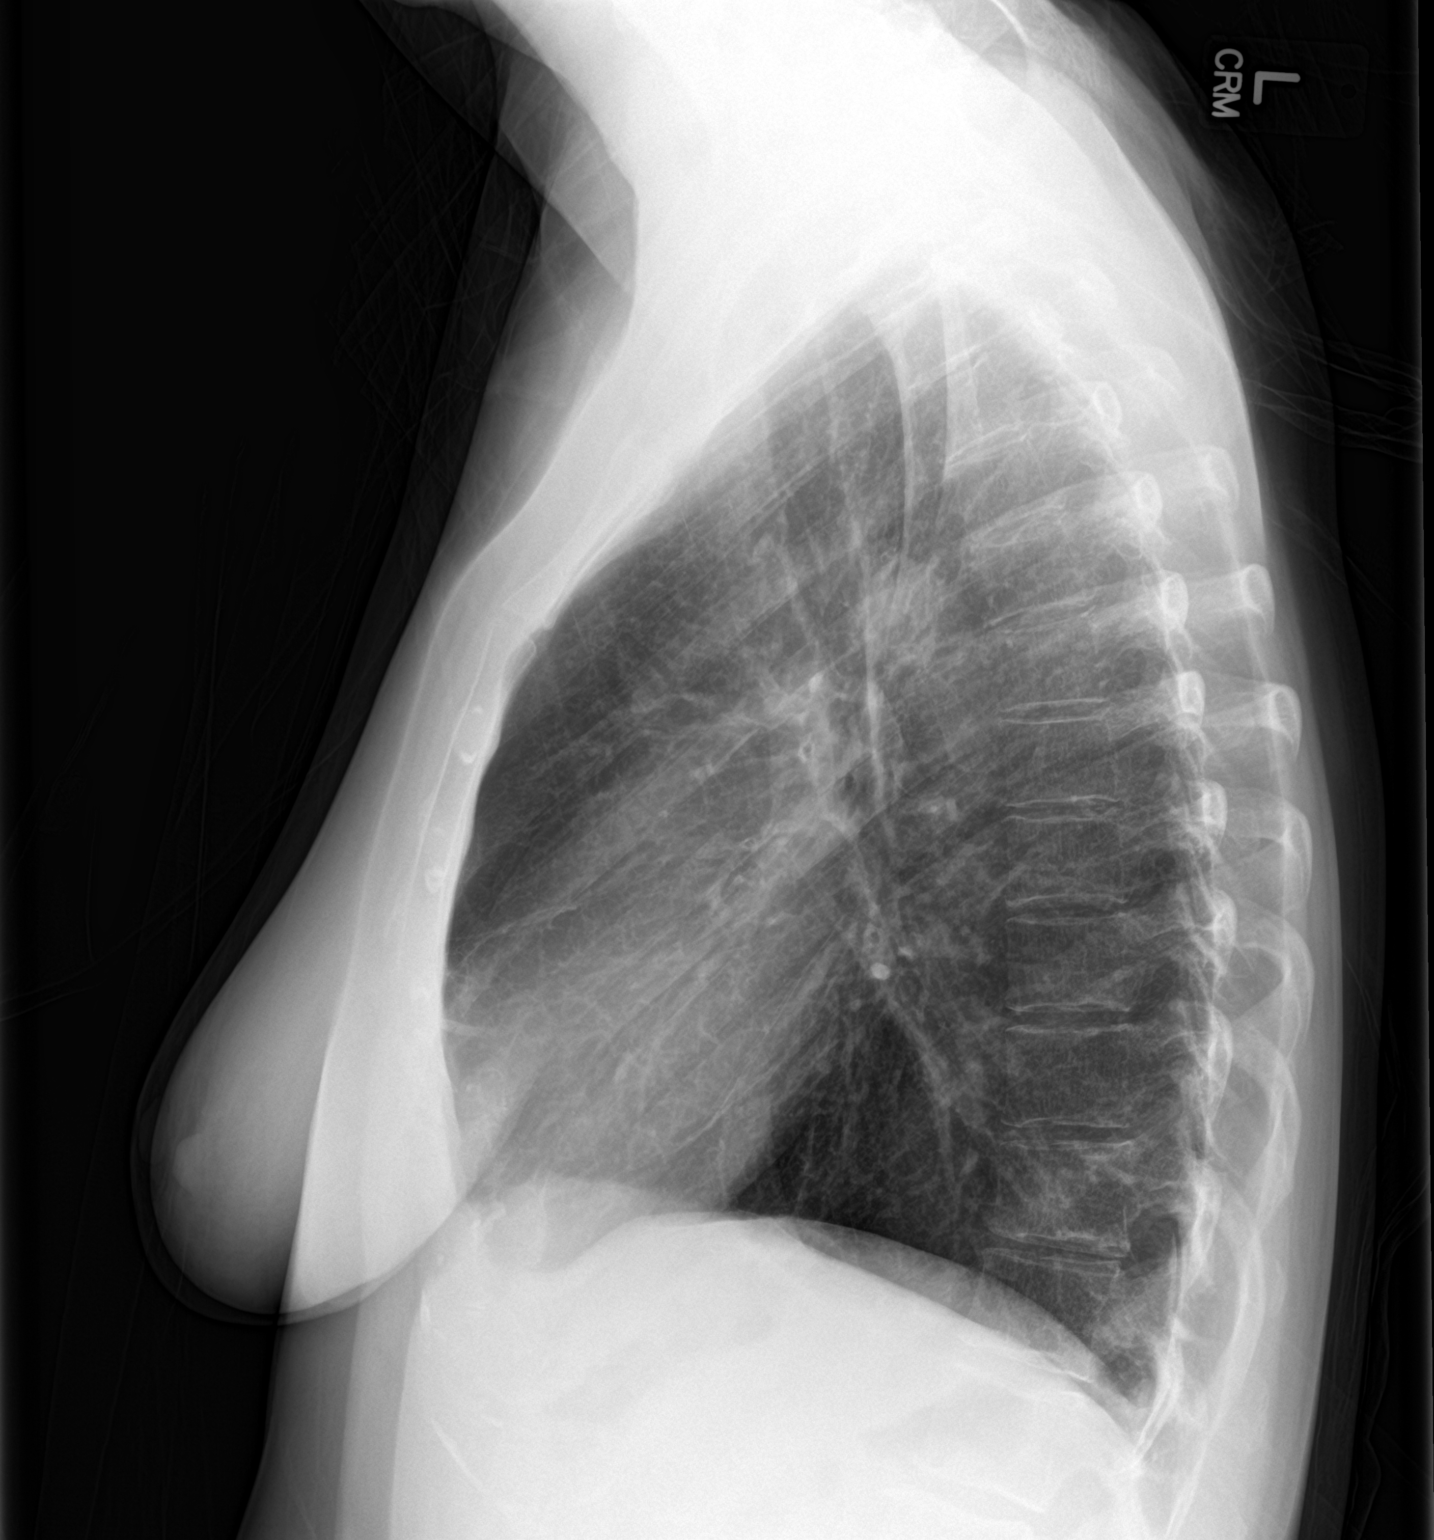

[2 of 2 positions shown; findings below may reference images not displayed]

FINDINGS: Mediastinum hilar structures normal. Lungs are clear. No pleural
effusion or pneumothorax. Heart size normal. No acute bony
abnormality.
IMPRESSION: No acute abnormality.

## 2020-04-27 ENCOUNTER — Ambulatory Visit: Payer: Self-pay

## 2020-04-27 ENCOUNTER — Ambulatory Visit (INDEPENDENT_AMBULATORY_CARE_PROVIDER_SITE_OTHER): Payer: BC Managed Care – PPO

## 2020-04-27 ENCOUNTER — Other Ambulatory Visit: Payer: Self-pay

## 2020-04-27 ENCOUNTER — Ambulatory Visit: Payer: BC Managed Care – PPO | Admitting: Podiatry

## 2020-04-27 DIAGNOSIS — R52 Pain, unspecified: Secondary | ICD-10-CM

## 2020-04-27 DIAGNOSIS — M722 Plantar fascial fibromatosis: Secondary | ICD-10-CM | POA: Diagnosis not present

## 2020-04-27 DIAGNOSIS — M79671 Pain in right foot: Secondary | ICD-10-CM

## 2020-04-27 NOTE — Patient Instructions (Addendum)
I have ordered a medication for you that will come from [facility] in [place]. They should be calling you to verify insurance and will mail the medication to you. If you live close by then you can go by their pharmacy to pick up the medication. Their phone number is [phone]. If you do not hear from them in the next few days, please give us a call at [phone].   Plantar Fasciitis (Heel Spur Syndrome) with Rehab The plantar fascia is a fibrous, ligament-like, soft-tissue structure that spans the bottom of the foot. Plantar fasciitis is a condition that causes pain in the foot due to inflammation of the tissue. SYMPTOMS  Pain and tenderness on the underneath side of the foot. Pain that worsens with standing or walking. CAUSES  Plantar fasciitis is caused by irritation and injury to the plantar fascia on the underneath side of the foot. Common mechanisms of injury include: Direct trauma to bottom of the foot. Damage to a small nerve that runs under the foot where the main fascia attaches to the heel bone. Stress placed on the plantar fascia due to bone spurs. RISK INCREASES WITH:  Activities that place stress on the plantar fascia (running, jumping, pivoting, or cutting). Poor strength and flexibility. Improperly fitted shoes. Tight calf muscles. Flat feet. Failure to warm-up properly before activity. Obesity. PREVENTION Warm up and stretch properly before activity. Allow for adequate recovery between workouts. Maintain physical fitness: Strength, flexibility, and endurance. Cardiovascular fitness. Maintain a health body weight. Avoid stress on the plantar fascia. Wear properly fitted shoes, including arch supports for individuals who have flat feet.  PROGNOSIS  If treated properly, then the symptoms of plantar fasciitis usually resolve without surgery. However, occasionally surgery is necessary.  RELATED COMPLICATIONS  Recurrent symptoms that may result in a  chronic condition. Problems of the lower back that are caused by compensating for the injury, such as limping. Pain or weakness of the foot during push-off following surgery. Chronic inflammation, scarring, and partial or complete fascia tear, occurring more often from repeated injections.  TREATMENT  Treatment initially involves the use of ice and medication to help reduce pain and inflammation. The use of strengthening and stretching exercises may help reduce pain with activity, especially stretches of the Achilles tendon. These exercises may be performed at home or with a therapist. Your caregiver may recommend that you use heel cups of arch supports to help reduce stress on the plantar fascia. Occasionally, corticosteroid injections are given to reduce inflammation. If symptoms persist for greater than 6 months despite non-surgical (conservative), then surgery may be recommended.   MEDICATION  If pain medication is necessary, then nonsteroidal anti-inflammatory medications, such as aspirin and ibuprofen, or other minor pain relievers, such as acetaminophen, are often recommended. Do not take pain medication within 7 days before surgery. Prescription pain relievers may be given if deemed necessary by your caregiver. Use only as directed and only as much as you need. Corticosteroid injections may be given by your caregiver. These injections should be reserved for the most serious cases, because they may only be given a certain number of times.  HEAT AND COLD Cold treatment (icing) relieves pain and reduces inflammation. Cold treatment should be applied for 10 to 15 minutes every 2 to 3 hours for inflammation and pain and immediately after any activity that aggravates your symptoms. Use ice packs or massage the area with a piece of ice (ice massage). Heat treatment may be used prior to performing the stretching   and strengthening activities prescribed by your caregiver, physical therapist, or  athletic trainer. Use a heat pack or soak the injury in warm water.  SEEK IMMEDIATE MEDICAL CARE IF: Treatment seems to offer no benefit, or the condition worsens. Any medications produce adverse side effects.  EXERCISES- RANGE OF MOTION (ROM) AND STRETCHING EXERCISES - Plantar Fasciitis (Heel Spur Syndrome) These exercises may help you when beginning to rehabilitate your injury. Your symptoms may resolve with or without further involvement from your physician, physical therapist or athletic trainer. While completing these exercises, remember:  Restoring tissue flexibility helps normal motion to return to the joints. This allows healthier, less painful movement and activity. An effective stretch should be held for at least 30 seconds. A stretch should never be painful. You should only feel a gentle lengthening or release in the stretched tissue.  RANGE OF MOTION - Toe Extension, Flexion Sit with your right / left leg crossed over your opposite knee. Grasp your toes and gently pull them back toward the top of your foot. You should feel a stretch on the bottom of your toes and/or foot. Hold this stretch for 10 seconds. Now, gently pull your toes toward the bottom of your foot. You should feel a stretch on the top of your toes and or foot. Hold this stretch for 10 seconds. Repeat  times. Complete this stretch 3 times per day.   RANGE OF MOTION - Ankle Dorsiflexion, Active Assisted Remove shoes and sit on a chair that is preferably not on a carpeted surface. Place right / left foot under knee. Extend your opposite leg for support. Keeping your heel down, slide your right / left foot back toward the chair until you feel a stretch at your ankle or calf. If you do not feel a stretch, slide your bottom forward to the edge of the chair, while still keeping your heel down. Hold this stretch for 10 seconds. Repeat 3 times. Complete this stretch 2 times per day.   STRETCH  Gastroc, Standing Place  hands on wall. Extend right / left leg, keeping the front knee somewhat bent. Slightly point your toes inward on your back foot. Keeping your right / left heel on the floor and your knee straight, shift your weight toward the wall, not allowing your back to arch. You should feel a gentle stretch in the right / left calf. Hold this position for 10 seconds. Repeat 3 times. Complete this stretch 2 times per day.  STRETCH  Soleus, Standing Place hands on wall. Extend right / left leg, keeping the other knee somewhat bent. Slightly point your toes inward on your back foot. Keep your right / left heel on the floor, bend your back knee, and slightly shift your weight over the back leg so that you feel a gentle stretch deep in your back calf. Hold this position for 10 seconds. Repeat 3 times. Complete this stretch 2 times per day.  STRETCH  Gastrocsoleus, Standing  Note: This exercise can place a lot of stress on your foot and ankle. Please complete this exercise only if specifically instructed by your caregiver.  Place the ball of your right / left foot on a step, keeping your other foot firmly on the same step. Hold on to the wall or a rail for balance. Slowly lift your other foot, allowing your body weight to press your heel down over the edge of the step. You should feel a stretch in your right / left calf. Hold this position for   10 seconds. Repeat this exercise with a slight bend in your right / left knee. Repeat 3 times. Complete this stretch 2 times per day.   STRENGTHENING EXERCISES - Plantar Fasciitis (Heel Spur Syndrome)  These exercises may help you when beginning to rehabilitate your injury. They may resolve your symptoms with or without further involvement from your physician, physical therapist or athletic trainer. While completing these exercises, remember:  Muscles can gain both the endurance and the strength needed for everyday activities through controlled exercises. Complete  these exercises as instructed by your physician, physical therapist or athletic trainer. Progress the resistance and repetitions only as guided.  STRENGTH - Towel Curls Sit in a chair positioned on a non-carpeted surface. Place your foot on a towel, keeping your heel on the floor. Pull the towel toward your heel by only curling your toes. Keep your heel on the floor. Repeat 3 times. Complete this exercise 2 times per day.  STRENGTH - Ankle Inversion Secure one end of a rubber exercise band/tubing to a fixed object (table, pole). Loop the other end around your foot just before your toes. Place your fists between your knees. This will focus your strengthening at your ankle. Slowly, pull your big toe up and in, making sure the band/tubing is positioned to resist the entire motion. Hold this position for 10 seconds. Have your muscles resist the band/tubing as it slowly pulls your foot back to the starting position. Repeat 3 times. Complete this exercises 2 times per day.  Document Released: 07/24/2005 Document Revised: 10/16/2011 Document Reviewed: 11/05/2008 [employer] Patient Information 2014 [employer].  

## 2020-04-28 NOTE — Progress Notes (Signed)
Subjective:   Patient ID: Tiffany Schroeder, female   DOB: 54 y.o.   MRN: [id number]   HPI 54 year old female presents the office today for concerns of painful knots on the plantar aspects of bilateral feet which has been getting worse over the last 6 months.  Did not change in size since she noticed them.  She said no recent treatment.  She does have a remote history of plantar fasciitis.  She said no recent treatment no other concerns today.  No injury.   Review of Systems  All other systems reviewed and are negative.  Past Medical History:  Diagnosis Date  . Acid reflux     No past surgical history on file.   Current Outpatient Medications:  .  famotidine (PEPCID) 40 MG tablet, famotidine 40 mg tablet  TAKE ONE TABLET (40 MG DOSE) BY MOUTH 2 (TWO) TIMES DAILY., Disp: , Rfl:  .  acetaminophen (TYLENOL) 325 MG tablet, Take by mouth., Disp: , Rfl:  .  albuterol (VENTOLIN HFA) 108 (90 Base) MCG/ACT inhaler, Inhale 2 puffs into the lungs every 4 (four) hours as needed., Disp: , Rfl:  .  albuterol (VENTOLIN HFA) 108 (90 Base) MCG/ACT inhaler, albuterol sulfate HFA 90 mcg/actuation aerosol inhaler, Disp: , Rfl:  .  benzonatate (TESSALON) 100 MG capsule, Take 1 capsule (100 mg total) by mouth every 8 (eight) hours., Disp: 21 capsule, Rfl: 0 .  bisacodyl (DULCOLAX) 5 MG EC tablet, Gentle Laxative (bisacodyl) 5 mg tablet,delayed release  TAKE 2 TABLETS BY MOUTH TWICE A DAY AS DIRECTED FOR 1 DAY, Disp: , Rfl:  .  cephALEXin (KEFLEX) 500 MG capsule, Keflex 500 mg capsule  Take 1 capsule every 6 hours by oral route for 7 days., Disp: , Rfl:  .  Cimetidine (ACID REDUCER PO), Take 1 tablet by mouth daily., Disp: , Rfl:  .  famotidine (PEPCID) 40 MG tablet, Take 40 mg by mouth 2 (two) times daily., Disp: , Rfl:  .  ibuprofen (ADVIL,MOTRIN) 200 MG tablet, Take 200 mg by mouth every 6 (six) hours as needed for pain., Disp: , Rfl:  .  levofloxacin (LEVAQUIN) 500 MG tablet, levofloxacin 500 mg tablet,  Disp: , Rfl:  .  methylPREDNISolone acetate (DEPO-MEDROL) 80 MG/ML injection, Depo-Medrol 80 mg/mL suspension for injection  Take 80 mg every day by injection route for 1 day., Disp: , Rfl:  .  ondansetron (ZOFRAN) 4 MG tablet, Take 1 tablet (4 mg total) by mouth every 6 (six) hours., Disp: 12 tablet, Rfl: 0 .  polyethylene glycol-electrolytes (GAVILYTE-N WITH FLAVOR PACK) 420 g solution, GaviLyte-N 420 gram oral solution, Disp: , Rfl:  .  promethazine (PHENERGAN) 25 MG tablet, Take 25 mg by mouth every 6 (six) hours as needed.  , Disp: , Rfl:  .  sulfamethoxazole-trimethoprim (BACTRIM DS) 800-160 MG tablet, sulfamethoxazole 800 mg-trimethoprim 160 mg tablet  TAKE 1 TABLET BY MOUTH EVERY 12 HOURS FOR 10 DAYS, Disp: , Rfl:   Allergies  Allergen Reactions  . Amoxicillin-Pot Clavulanate Hives and Rash  . Penicillins Itching        Objective:  Physical Exam  General: AAO x3, NAD  Dermatological: Skin is warm, dry and supple bilateral. There are no open sores, no preulcerative lesions, no rash or signs of infection present.  Vascular: Dorsalis Pedis artery and Posterior Tibial artery pedal pulses are 2/4 bilateral with immedate capillary fill time. There is no pain with calf compression, swelling, warmth, erythema.   Neruologic: Grossly intact via light touch bilateral.  Negative tinel sign  Musculoskeletal: Firm nodules present on the medial band plantar fascial's bilaterally consider the plantar fibroma to the left side worse than the right.  Left side slightly larger and more discomfort on the right side.  Left side has a smaller lesion as well along the more distal aspect of the plantar fascia.  No area of pinpoint tenderness.  Muscular strength 5/5 in all groups tested bilateral.  Gait: Unassisted, Nonantalgic.       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  There is no evidence of acute fracture or stress fracture no foreign body or calcifications noted -Discussed steroid injection also ordered.  [facility] compound cream to the [facility] to include verapamil.  Discussed traction exercise as well as icing.  Discussed shoe modifications and orthotics.  Consider negative custom insert offloading area if needed.  Vivi Barrack DPM

## 2021-03-12 DIAGNOSIS — M47816 Spondylosis without myelopathy or radiculopathy, lumbar region: Secondary | ICD-10-CM | POA: Insufficient documentation

## 2021-07-11 DIAGNOSIS — M545 Low back pain, unspecified: Secondary | ICD-10-CM | POA: Insufficient documentation

## 2023-11-07 ENCOUNTER — Other Ambulatory Visit: Payer: Self-pay | Admitting: Podiatry

## 2023-11-07 DIAGNOSIS — M722 Plantar fascial fibromatosis: Secondary | ICD-10-CM

## 2023-11-11 ENCOUNTER — Ambulatory Visit
Admission: RE | Admit: 2023-11-11 | Discharge: 2023-11-11 | Disposition: A | Discharge status: Home / Self Care | Payer: Self-pay | Source: Ambulatory Visit | Attending: Podiatry | Admitting: Podiatry

## 2023-11-11 DIAGNOSIS — M722 Plantar fascial fibromatosis: Secondary | ICD-10-CM

## 2024-06-12 ENCOUNTER — Other Ambulatory Visit: Payer: Self-pay | Admitting: Podiatry

## 2024-06-12 DIAGNOSIS — M722 Plantar fascial fibromatosis: Secondary | ICD-10-CM

## 2024-06-20 ENCOUNTER — Encounter: Payer: Self-pay | Admitting: Podiatry

## 2024-06-27 ENCOUNTER — Ambulatory Visit
Admission: RE | Admit: 2024-06-27 | Discharge: 2024-06-27 | Disposition: A | Discharge status: Home / Self Care | Source: Ambulatory Visit | Attending: Podiatry | Admitting: Podiatry

## 2024-06-27 ENCOUNTER — Other Ambulatory Visit

## 2024-06-27 DIAGNOSIS — M722 Plantar fascial fibromatosis: Secondary | ICD-10-CM
